# Patient Record
Sex: Female | Born: 1990 | Race: White | Hispanic: No | Marital: Single | Smoking: Never smoker
Health system: Southern US, Community
[De-identification: ages and names within clinical notes are randomized; demographics above are authoritative.]

## PROBLEM LIST (undated history)

## (undated) DIAGNOSIS — J329 Chronic sinusitis, unspecified: Secondary | ICD-10-CM

## (undated) DIAGNOSIS — E079 Disorder of thyroid, unspecified: Secondary | ICD-10-CM

## (undated) HISTORY — PX: EYE SURGERY: SHX253

## (undated) HISTORY — PX: APPENDECTOMY: SHX54

---

## 2014-09-06 ENCOUNTER — Ambulatory Visit
Admission: RE | Admit: 2014-09-06 | Discharge: 2014-09-06 | Disposition: A | Discharge status: Home / Self Care | Payer: BC Managed Care – PPO | Source: Ambulatory Visit | Attending: Otolaryngology | Admitting: Otolaryngology

## 2014-09-06 ENCOUNTER — Other Ambulatory Visit: Payer: Self-pay | Admitting: Otolaryngology

## 2014-09-06 DIAGNOSIS — R0981 Nasal congestion: Secondary | ICD-10-CM

## 2014-09-06 DIAGNOSIS — R0982 Postnasal drip: Secondary | ICD-10-CM

## 2014-10-19 ENCOUNTER — Ambulatory Visit
Admission: RE | Admit: 2014-10-19 | Discharge: 2014-10-19 | Disposition: A | Discharge status: Home / Self Care | Payer: BC Managed Care – PPO | Source: Ambulatory Visit | Attending: Emergency Medicine | Admitting: Emergency Medicine

## 2014-10-19 ENCOUNTER — Other Ambulatory Visit: Payer: Self-pay | Admitting: Emergency Medicine

## 2014-10-19 DIAGNOSIS — R109 Unspecified abdominal pain: Secondary | ICD-10-CM

## 2015-08-29 ENCOUNTER — Ambulatory Visit: Payer: Self-pay

## 2015-08-29 ENCOUNTER — Other Ambulatory Visit: Payer: Self-pay | Admitting: Occupational Medicine

## 2015-08-29 DIAGNOSIS — M25562 Pain in left knee: Secondary | ICD-10-CM

## 2016-08-07 IMAGING — CT CT ABD-PELV W/O CM
3 of 4 series · 8 of 46 positions shown, 15 images · non-contrast
Comparison: None.

CLINICAL DATA: Intermittent right flank pain for 3 days.

EXAM:
CT ABDOMEN AND PELVIS WITHOUT CONTRAST
TECHNIQUE: Multidetector CT imaging of the abdomen and pelvis was performed
following the standard protocol without IV contrast.

[Series 3: lung windows · axial · 0.68mm/px · z∈[-92,-32]mm · 4 of 21 slices shown, 9 images]
[im 5/21  soft-tissue]
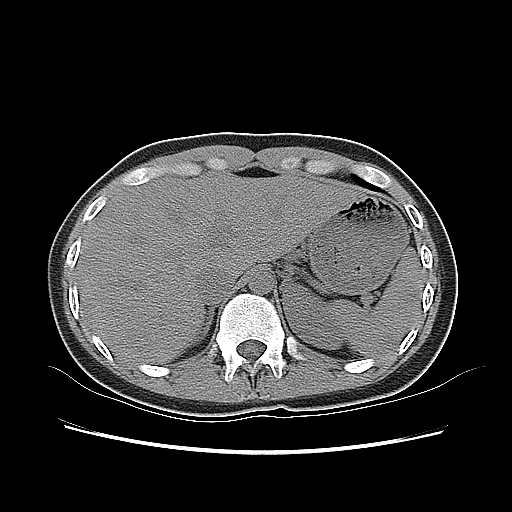
[im 5/21  lung]
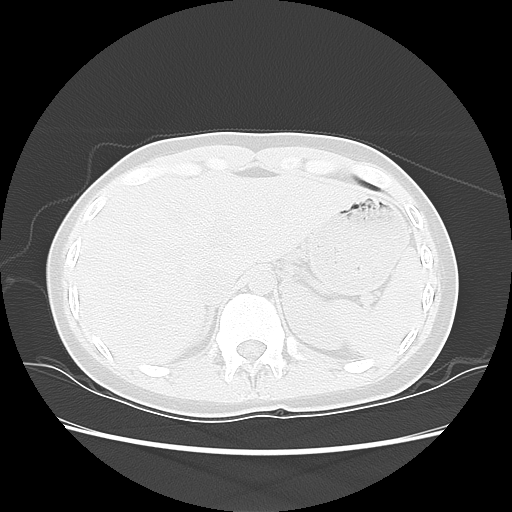
[im 5/21  bone]
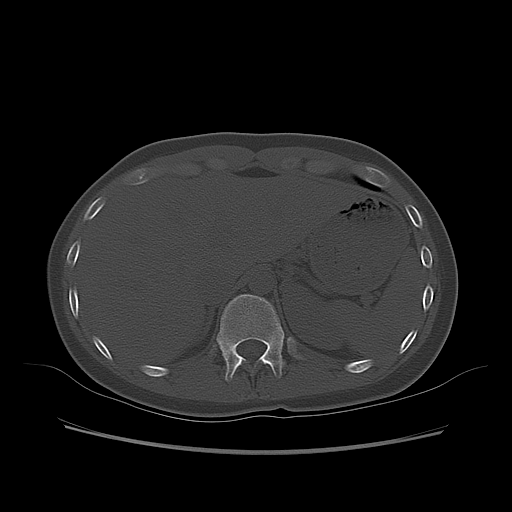
[im 9/21  soft-tissue]
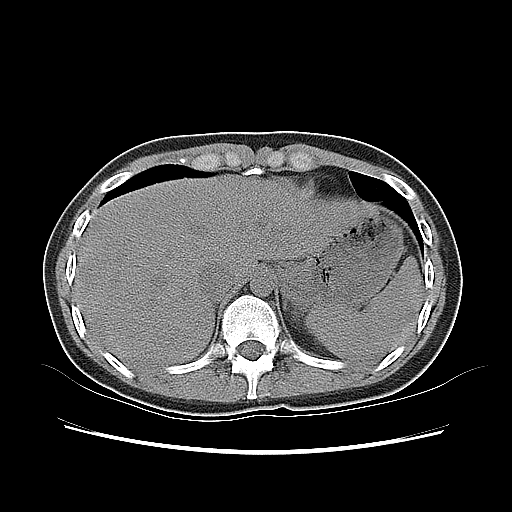
[im 9/21  lung]
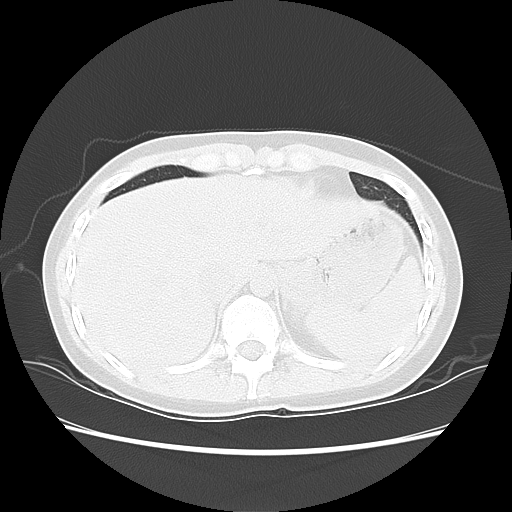
[im 13/21  soft-tissue]
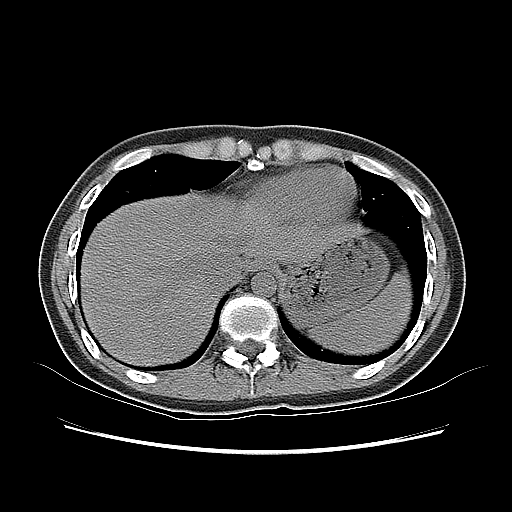
[im 13/21  lung]
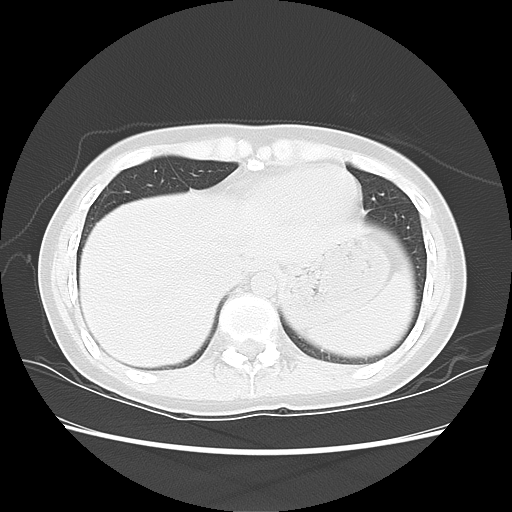
[im 17/21  soft-tissue]
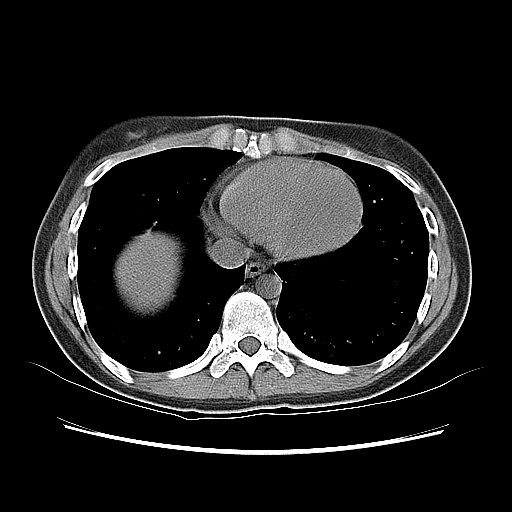
[im 17/21  lung]
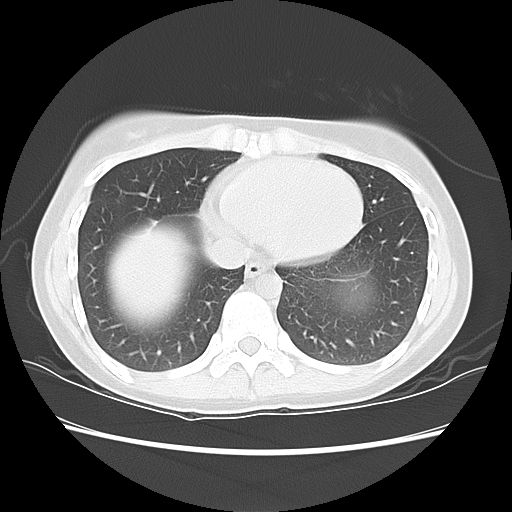

[Series 400: cor · coronal · 0.98mm/px · 3 of 116 slices shown, 4 images]
[im 39/116  soft-tissue]
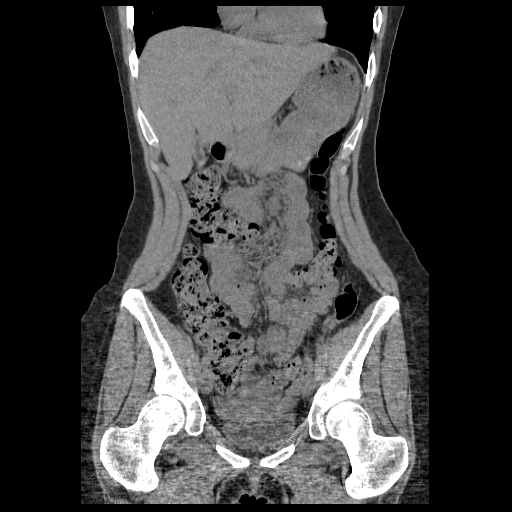
[im 52/116  soft-tissue]
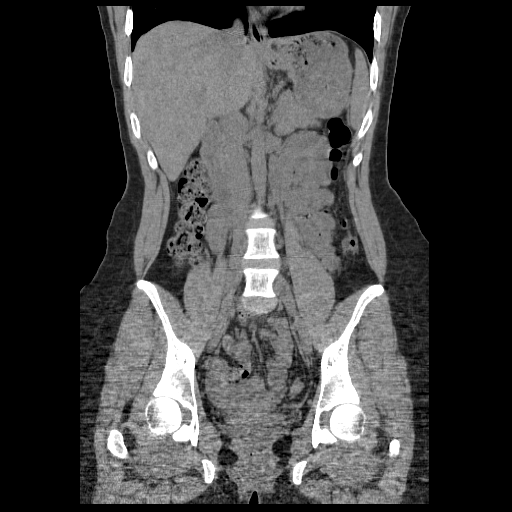
[im 52/116  bone]
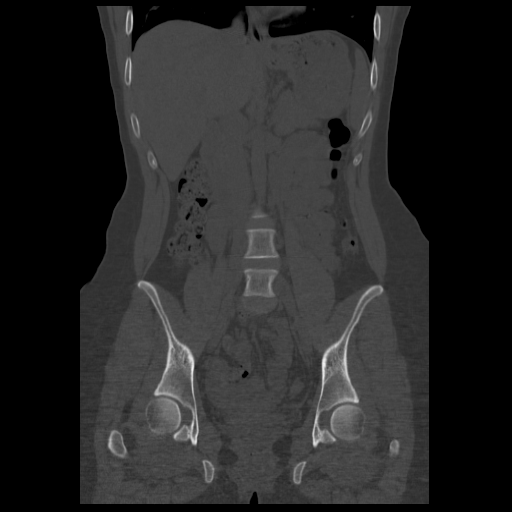
[im 64/116  soft-tissue]
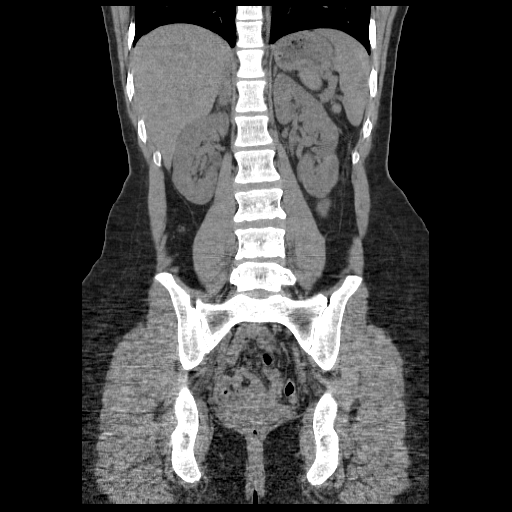

[Series 401: sag · sagittal · 0.98mm/px · 1 of 174 slices shown, 2 images]
[im 58/174  soft-tissue]
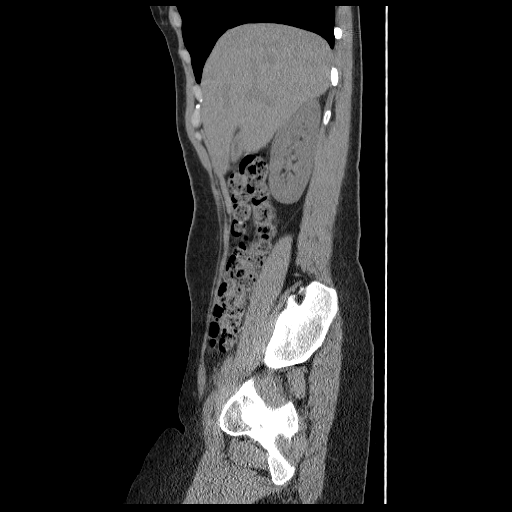
[im 58/174  bone]
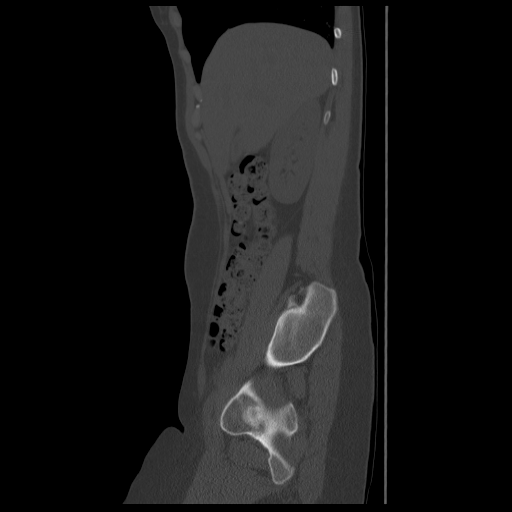

[8 of 46 positions shown; findings below may reference images not displayed]

FINDINGS: Lower chest: The lung bases are clear of acute process. No pleural
effusion or pulmonary lesions. The heart is normal in size. No
pericardial effusion. The distal esophagus and aorta are
unremarkable.

Hepatobiliary: No focal hepatic lesions or intrahepatic biliary
dilatation. The gallbladder is contracted. No common bowel duct
dilatation.

Pancreas: Normal

Spleen: Normal

Adrenals/Urinary Tract: The adrenal glands and kidneys are
unremarkable. No renal or obstructing ureteral calculi or bladder
calculi. There is a duplicated left collecting system.

Stomach/Bowel: The stomach, duodenum, small bowel and colon are
grossly normal without oral contrast. No inflammatory changes, mass
lesions or obstructive findings. The appendix is surgically absent.

Vascular/Lymphatic: No mesenteric or retroperitoneal mass or
adenopathy. The aorta is normal in caliber. No atherosclerotic
calcifications.

Reproductive: The uterus and ovaries are unremarkable. The bladder
is normal. No pelvic mass, adenopathy or free pelvic fluid
collections. No inguinal mass or adenopathy.

Other: No abdominal wall hernia or subcutaneous lesions.

Musculoskeletal: Normal.
IMPRESSION: No acute abdominal/ pelvic findings, mass lesions or adenopathy.

No renal, ureteral or bladder calculi.

## 2016-09-17 ENCOUNTER — Encounter: Payer: Self-pay | Admitting: Emergency Medicine

## 2016-09-17 ENCOUNTER — Ambulatory Visit
Admission: EM | Admit: 2016-09-17 | Discharge: 2016-09-17 | Disposition: A | Discharge status: Home / Self Care | Payer: BC Managed Care – PPO | Attending: Internal Medicine | Admitting: Internal Medicine

## 2016-09-17 DIAGNOSIS — J0110 Acute frontal sinusitis, unspecified: Secondary | ICD-10-CM | POA: Diagnosis not present

## 2016-09-17 LAB — RAPID INFLUENZA A&B ANTIGENS
Influenza A (ARMC): NEGATIVE
Influenza B (ARMC): NEGATIVE

## 2016-09-17 LAB — RAPID STREP SCREEN (MED CTR MEBANE ONLY): STREPTOCOCCUS, GROUP A SCREEN (DIRECT): NEGATIVE

## 2016-09-17 MED ORDER — AMOXICILLIN 500 MG PO CAPS: 500.0000 mg | ORAL_CAPSULE | Freq: Three times a day (TID) | ORAL | 0 refills | Status: DC

## 2016-09-17 NOTE — ED Provider Notes (Signed)
[facility]    CSN: [id number] Arrival date & time: 09/17/16  1836     History   Chief Complaint Chief Complaint  Patient presents with  . Sore Throat  . Cough    HPI Hailey Joseph is a 25 y.o. female.   Symptoms started with sore throat 4 days ago. The patient has been feeling very fatigued and going to bed early since that time. She's developed a nonproductive cough as well as headaches since that time. She denies fever, nausea, vomiting or body aches but she admits to sinus pressure. She has developed some "tan-colored" nasal drainage today.      History reviewed. No pertinent past medical history.  There are no active problems to display for this patient.   Past Surgical History:  Procedure Laterality Date  . APPENDECTOMY      OB History    No data available       Home Medications    Prior to Admission medications   Medication Sig Start Date End Date Taking? Authorizing Provider  levothyroxine (SYNTHROID, LEVOTHROID) 50 MCG tablet Take 50 mcg by mouth daily before breakfast.   Yes Historical Provider, MD  amoxicillin (AMOXIL) 500 MG capsule Take 1 capsule (500 mg total) by mouth 3 (three) times daily. 09/17/16   Arnaldo NatalMichael S Guiseppe Flanagan, MD    Family History History reviewed. No pertinent family history.  Social History Social History  Substance Use Topics  . Smoking status: Never Smoker  . Smokeless tobacco: Never Used  . Alcohol use No     Allergies   Phenergan [promethazine] and Toradol [ketorolac tromethamine]   Review of Systems Review of Systems  Constitutional: Negative for chills and fever.  HENT: Positive for sinus pressure and sore throat. Negative for tinnitus.   Eyes: Negative for redness.  Respiratory: Positive for cough. Negative for shortness of breath.   Cardiovascular: Negative for chest pain and palpitations.  Gastrointestinal: Negative for abdominal pain, diarrhea, nausea and vomiting.  Genitourinary: Negative for  dysuria, frequency and urgency.  Musculoskeletal: Negative for myalgias.  Skin: Negative for rash.       No lesions  Neurological: Positive for headaches. Negative for weakness.  Hematological: Does not bruise/bleed easily.  Psychiatric/Behavioral: Negative for suicidal ideas.     Physical Exam Triage Vital Signs ED Triage Vitals  Enc Vitals Group     BP 09/17/16 1848 127/64     Pulse Rate 09/17/16 1848 98     Resp 09/17/16 1848 16     Temp 09/17/16 1848 99 F (37.2 C)     Temp Source 09/17/16 1848 Tympanic     SpO2 09/17/16 1848 99 %     Weight 09/17/16 1846 160 lb (72.6 kg)     Height 09/17/16 1846 5\' 9"  (1.753 m)     Head Circumference --      Peak Flow --      Pain Score 09/17/16 1848 8     Pain Loc --      Pain Edu? --      Excl. in GC? --    No data found.   Updated Vital Signs BP 127/64 (BP Location: Left Arm)   Pulse 98   Temp 99 F (37.2 C) (Tympanic)   Resp 16   Ht 5\' 9"  (1.753 m)   Wt 160 lb (72.6 kg)   LMP 09/05/2016 (Exact Date)   SpO2 99%   BMI 23.63 kg/m   Visual Acuity Right Eye Distance:   Left Eye  Distance:   Bilateral Distance:    Right Eye Near:   Left Eye Near:    Bilateral Near:     Physical Exam  Constitutional: She is oriented to person, place, and time. She appears well-developed and well-nourished. No distress.  HENT:  Head: Normocephalic and atraumatic.  Mouth/Throat: Posterior oropharyngeal erythema present.  Tenderness over frontal and ethmoid sinuses  Eyes: Conjunctivae and EOM are normal. Pupils are equal, round, and reactive to light. No scleral icterus.  Neck: Normal range of motion. Neck supple. No JVD present. No tracheal deviation present. No thyromegaly present.  Cardiovascular: Normal rate, regular rhythm and normal heart sounds.  Exam reveals no gallop and no friction rub.   No murmur heard. Pulmonary/Chest: Effort normal and breath sounds normal.  Abdominal: Soft. Bowel sounds are normal. She exhibits no  distension. There is no tenderness.  Musculoskeletal: Normal range of motion. She exhibits no edema.  Lymphadenopathy:    She has no cervical adenopathy.  Neurological: She is alert and oriented to person, place, and time. No cranial nerve deficit.  Skin: Skin is warm and dry.  Psychiatric: She has a normal mood and affect. Her behavior is normal. Judgment and thought content normal.  Nursing note and vitals reviewed.    UC Treatments / Results  Labs (all labs ordered are listed, but only abnormal results are displayed) Labs Reviewed  RAPID STREP SCREEN (NOT AT [facility])  RAPID INFLUENZA A&B ANTIGENS ([facility] ONLY)  CULTURE, GROUP A STREP [facility])    EKG  EKG Interpretation None       Radiology No results found.  Procedures Procedures (including critical care time)  Medications Ordered in UC Medications - No data to display   Initial Impression / Assessment and Plan / UC Course  I have reviewed the triage vital signs and the nursing notes.  Pertinent labs & imaging results that were available during my care of the patient were reviewed by me and considered in my medical decision making (see chart for details).  Clinical Course     H/o sinusitis with developing symptoms.  Final Clinical Impressions(s) / UC Diagnoses   Final diagnoses:  Subacute frontal sinusitis    New Prescriptions New Prescriptions   AMOXICILLIN (AMOXIL) 500 MG CAPSULE    Take 1 capsule (500 mg total) by mouth 3 (three) times daily.     Arnaldo NatalMichael S Kalese Ensz, MD 09/17/16 [phone]

## 2016-09-17 NOTE — ED Triage Notes (Signed)
Patient c/o sore throat, HAs, and cough since Sunday.

## 2016-09-20 LAB — CULTURE, GROUP A STREP (THRC)

## 2016-09-22 ENCOUNTER — Telehealth: Payer: Self-pay | Admitting: *Deleted

## 2016-09-22 NOTE — Telephone Encounter (Signed)
Called patient, no answer, left message reporting a negative strep culture result. Advised patient to follow up with PCP or [facility] if symptoms persist. 

## 2016-12-21 ENCOUNTER — Ambulatory Visit
Admission: EM | Admit: 2016-12-21 | Discharge: 2016-12-21 | Disposition: A | Discharge status: Home / Self Care | Payer: BC Managed Care – PPO | Attending: Family Medicine | Admitting: Family Medicine

## 2016-12-21 ENCOUNTER — Encounter: Payer: Self-pay | Admitting: Emergency Medicine

## 2016-12-21 DIAGNOSIS — J0110 Acute frontal sinusitis, unspecified: Secondary | ICD-10-CM | POA: Diagnosis not present

## 2016-12-21 DIAGNOSIS — R110 Nausea: Secondary | ICD-10-CM | POA: Diagnosis not present

## 2016-12-21 HISTORY — DX: Disorder of thyroid, unspecified: E07.9

## 2016-12-21 MED ORDER — AMOXICILLIN-POT CLAVULANATE 875-125 MG PO TABS
1.0000 | ORAL_TABLET | Freq: Two times a day (BID) | ORAL | 0 refills | Status: AC
Start: 2016-12-21 — End: 2016-12-28

## 2016-12-21 MED ORDER — ONDANSETRON HCL 4 MG PO TABS: 4.0000 mg | ORAL_TABLET | Freq: Three times a day (TID) | ORAL | 0 refills | Status: DC | PRN

## 2016-12-21 NOTE — Discharge Instructions (Signed)
Recommend start Augmentin twice a day as directed. Increase fluid intake to maintain hydration. May take Zofran 4mg  every 8 hours as needed for nausea. Follow-up with a primary care provider in 2 to 3 days if not improving or go to ER if symptoms worsen.

## 2016-12-21 NOTE — ED Provider Notes (Signed)
CSN: [id number]     Arrival date & time 12/21/16  [id number] History   First MD Initiated Contact with Patient 12/21/16 1032     Chief Complaint  Patient presents with  . Nausea   (Consider location/radiation/quality/duration/timing/severity/associated sxs/prior Treatment) 26 year old female presents with headache, fatigue and nausea that started about 5 days ago. Now experiencing more sinus pressure, facial pain, dizziness and nasal congestion for the past 2 days. Vomited 2 days ago due to nausea. Denies any fever or coughing. 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 Had to use EpiPen and continues to feel fatigued. [place] moved to area from [place] and does not have a PCP.    The history is provided by the patient.    Past Medical History:  Diagnosis Date  . Thyroid disease    Past Surgical History:  Procedure Laterality Date  . APPENDECTOMY    . EYE SURGERY Bilateral    No family history on file. Social History  Substance Use Topics  . Smoking status: Never Smoker  . Smokeless tobacco: Never Used  . Alcohol use No   OB History    No data available     Review of Systems  Constitutional: Positive for appetite change and fatigue. Negative for chills and fever.  HENT: Positive for congestion, sinus pain and sinus pressure. Negative for ear pain and sore throat.   Eyes: Negative for discharge.  Respiratory: Negative for cough, chest tightness, shortness of breath and wheezing.   Cardiovascular: Negative for chest pain.  Gastrointestinal: Positive for nausea and vomiting. Negative for abdominal pain and diarrhea.  Musculoskeletal: Positive for arthralgias. Negative for neck pain and neck stiffness.  Skin: Negative for rash.  Neurological: Positive for dizziness and headaches. Negative for syncope and weakness.  Hematological: Negative for adenopathy.    Allergies  Phenergan  [promethazine] and Toradol [ketorolac tromethamine]  Home Medications   Prior to Admission medications   Medication Sig Start Date End Date Taking? Authorizing Provider  cetirizine (ZYRTEC) 10 MG tablet Take 10 mg by mouth daily.   Yes Historical Provider, MD  levothyroxine (SYNTHROID, LEVOTHROID) 50 MCG tablet Take 50 mcg by mouth daily before breakfast.   Yes Historical Provider, MD  amoxicillin-clavulanate (AUGMENTIN) 875-125 MG tablet Take 1 tablet by mouth every 12 (twelve) hours. 12/21/16 12/28/16  Sudie GrumblingAnn Berry Shanvi Moyd, NP  ondansetron (ZOFRAN) 4 MG tablet Take 1 tablet (4 mg total) by mouth every 8 (eight) hours as needed for nausea or vomiting. 12/21/16   Sudie GrumblingAnn Berry Sabastion Hrdlicka, NP   Meds Ordered and Administered this Visit  Medications - No data to display  BP 107/62 (BP Location: Left Arm)   Pulse 98   Temp 98.2 F (36.8 C) (Oral)   Resp 16   Ht 5\' 9"  (1.753 m)   Wt 150 lb (68 kg)   LMP 12/15/2016   SpO2 98%   BMI 22.15 kg/m  No data found.   Physical Exam  Constitutional: She is oriented to person, place, and time. She appears well-developed and well-nourished. She appears ill. No distress.  HENT:  Head: Normocephalic and atraumatic.  Right Ear: Hearing, tympanic membrane, external ear and ear canal normal.  Left Ear: Hearing, tympanic membrane, external ear and ear canal normal.  Nose: Mucosal edema and rhinorrhea present. Right sinus exhibits maxillary sinus tenderness and frontal sinus tenderness. Left sinus exhibits maxillary sinus tenderness and  frontal sinus tenderness.  Mouth/Throat: Uvula is midline, oropharynx is clear and moist and mucous membranes are normal.  Neck: Normal range of motion. Neck supple.  Cardiovascular: Normal rate, regular rhythm and normal heart sounds.   Pulmonary/Chest: Effort normal and breath sounds normal. No respiratory distress. She has no decreased breath sounds. She has no wheezes. She has no rhonchi.  Lymphadenopathy:    She has no cervical  adenopathy.  Neurological: She is alert and oriented to person, place, and time. She has normal strength. No cranial nerve deficit or sensory deficit.  Skin: Skin is warm and dry. Capillary refill takes less than 2 seconds. No rash noted.  Psychiatric: She has a normal mood and affect. Her behavior is normal. Judgment and thought content normal.    Urgent Care Course     Procedures (including critical care time)  Labs Review Labs Reviewed - No data to display  Imaging Review No results found.   Visual Acuity Review  Right Eye Distance:   Left Eye Distance:   Bilateral Distance:    Right Eye Near:   Left Eye Near:    Bilateral Near:         MDM   1. Nausea   2. Acute non-recurrent frontal sinusitis    Discussed with patient that symptoms may be the beginnings of a sinus infection. Recommend trial Augmentin 875mg  twice a day as directed. Increase fluid intake to maintain hydration. May take Zofran 4mg  every 8 hours as needed for nausea. Note written for work. Information provided regarding local PCP's- recommend follow-up with a PCP in 2 to 3 days if not improving or go to ER if symptoms worsen.     Sudie Grumbling, NP 12/22/16 0330

## 2016-12-21 NOTE — ED Triage Notes (Signed)
Nausea, vomiting, congested, headache, body aches and tired for 5

## 2017-01-22 ENCOUNTER — Ambulatory Visit
Admission: EM | Admit: 2017-01-22 | Discharge: 2017-01-22 | Disposition: A | Discharge status: Home / Self Care | Payer: BC Managed Care – PPO | Attending: Family Medicine | Admitting: Family Medicine

## 2017-01-22 DIAGNOSIS — M79641 Pain in right hand: Secondary | ICD-10-CM

## 2017-01-22 DIAGNOSIS — L089 Local infection of the skin and subcutaneous tissue, unspecified: Secondary | ICD-10-CM

## 2017-01-22 DIAGNOSIS — S60311A Abrasion of right thumb, initial encounter: Secondary | ICD-10-CM

## 2017-01-22 DIAGNOSIS — J0101 Acute recurrent maxillary sinusitis: Secondary | ICD-10-CM | POA: Diagnosis not present

## 2017-01-22 DIAGNOSIS — R42 Dizziness and giddiness: Secondary | ICD-10-CM

## 2017-01-22 DIAGNOSIS — R300 Dysuria: Secondary | ICD-10-CM | POA: Diagnosis not present

## 2017-01-22 LAB — URINALYSIS, COMPLETE (UACMP) WITH MICROSCOPIC
BACTERIA UA: NONE SEEN
Bilirubin Urine: NEGATIVE
Glucose, UA: NEGATIVE mg/dL
Hgb urine dipstick: NEGATIVE
Ketones, ur: NEGATIVE mg/dL
Leukocytes, UA: NEGATIVE
Nitrite: NEGATIVE
PROTEIN: NEGATIVE mg/dL
RBC / HPF: NONE SEEN RBC/hpf (ref 0–5)
SPECIFIC GRAVITY, URINE: 1.02 (ref 1.005–1.030)
pH: 7 (ref 5.0–8.0)

## 2017-01-22 LAB — RAPID STREP SCREEN (MED CTR MEBANE ONLY): STREPTOCOCCUS, GROUP A SCREEN (DIRECT): NEGATIVE

## 2017-01-22 MED ORDER — FEXOFENADINE-PSEUDOEPHED ER 180-240 MG PO TB24: 1.0000 | ORAL_TABLET | Freq: Every day | ORAL | 0 refills | Status: AC

## 2017-01-22 MED ORDER — CEFUROXIME AXETIL 500 MG PO TABS: 500.0000 mg | ORAL_TABLET | Freq: Two times a day (BID) | ORAL | 0 refills | Status: AC

## 2017-01-22 MED ORDER — FLUTICASONE PROPIONATE 50 MCG/ACT NA SUSP: 2.0000 | Freq: Every day | NASAL | 0 refills | Status: AC

## 2017-01-22 MED ORDER — MECLIZINE HCL 25 MG PO TABS: 25.0000 mg | ORAL_TABLET | Freq: Three times a day (TID) | ORAL | 0 refills | Status: AC | PRN

## 2017-01-22 MED ORDER — MUPIROCIN 2 % EX OINT: 1.0000 "application " | TOPICAL_OINTMENT | Freq: Three times a day (TID) | CUTANEOUS | 0 refills | Status: AC

## 2017-01-22 MED ORDER — PHENAZOPYRIDINE HCL 200 MG PO TABS: 200.0000 mg | ORAL_TABLET | Freq: Three times a day (TID) | ORAL | 0 refills | Status: AC | PRN

## 2017-01-22 NOTE — ED Provider Notes (Signed)
[facility]    CSN: [id number] Arrival date & time: 01/22/17  [id number]     History   Chief Complaint Chief Complaint  Patient presents with  . Urinary Tract Infection  . Sinusitis  . Hand Pain    Right Thumb    HPI Hailey Joseph is a 26 y.o. female.   She is a 26 year old white female early in tears crying profusely.   #1 She states that about 11 days ago she started having sinus trouble nasal congestion coughing sinus pressure and sore throat. Long sinus pressure and sore throat she reports nasal congestion and red. She's had lymph node swollen on both sides states that she does have trouble for sinuses this is been unrelenting and now she started to feel dizzy and lightheaded as well. She reports having some Zofran at home. Was greenish material which blows her nose. She does not smoke does not snuff. She does have Hashimoto's thyroid disease she's had appendectomy. Mother does have sinus surgery as well.  #2 reports also a severe sore throat this been going on for about the same amount time   #3 she reports hurting her right thumb cuticle area. She states she was trimming her, she tended to close analysis swollen red and painful  #4 frequency and burning with urination she also reports having some frequency and burning with urination going to the bathroom more often this is been going on for several days as well but nonspecific for the same time as above illnesses. She has had a history of UTIs before in the past      The history is provided by the patient. No language interpreter was used.  Urinary Tract Infection  Pain quality:  Burning Pain severity:  Moderate Onset quality:  Sudden Timing:  Constant Progression:  Worsening Chronicity:  New Recent urinary tract infections: no   Relieved by:  Nothing Worsened by:  Nothing Urinary symptoms: frequent urination   Associated symptoms: abdominal pain   Sinusitis  Pain details:    Location:  Maxillary  Quality:  Aching   Severity:  Moderate   Duration:  11 days   Timing:  Constant Progression:  Worsening Chronicity:  Recurrent Associated symptoms: rhinorrhea and sore throat   Hand Pain  This is a new problem. The current episode started more than 2 days ago. The problem occurs constantly. The problem has been gradually worsening. Associated symptoms include abdominal pain.  Sore Throat  This is a new problem. The current episode started more than 1 week ago. The problem occurs constantly. The problem has been gradually worsening. Associated symptoms include abdominal pain.    Past Medical History:  Diagnosis Date  . Thyroid disease     There are no active problems to display for this patient.   Past Surgical History:  Procedure Laterality Date  . APPENDECTOMY    . EYE SURGERY Bilateral     OB History    No data available       Home Medications    Prior to Admission medications   Medication Sig Start Date End Date Taking? Authorizing Provider  cetirizine (ZYRTEC) 10 MG tablet Take 10 mg by mouth daily.   Yes Historical Provider, MD  levothyroxine (SYNTHROID, LEVOTHROID) 50 MCG tablet Take 50 mcg by mouth daily before breakfast.   Yes Historical Provider, MD  cefUROXime (CEFTIN) 500 MG tablet Take 1 tablet (500 mg total) by mouth 2 (two) times daily. 01/22/17   Hassan RowanEugene Madhuri Vacca, MD  fexofenadine-pseudoephedrine Elvera Maria(ALLEGRA-D  ALLERGY & CONGESTION) 180-240 MG 24 hr tablet Take 1 tablet by mouth daily. 01/22/17   Hassan Rowan, MD  fluticasone (FLONASE) 50 MCG/ACT nasal spray Place 2 sprays into both nostrils daily. 01/22/17   Hassan Rowan, MD  meclizine (ANTIVERT) 25 MG tablet Take 1 tablet (25 mg total) by mouth 3 (three) times daily as needed for dizziness. 01/22/17   Hassan Rowan, MD  mupirocin ointment (BACTROBAN) 2 % Apply 1 application topically 3 (three) times daily. 01/22/17   Hassan Rowan, MD  phenazopyridine (PYRIDIUM) 200 MG tablet Take 1 tablet (200 mg total) by mouth 3 (three)  times daily as needed for pain. 01/22/17   Hassan Rowan, MD    Family History History reviewed. No pertinent family history.  Social History Social History  Substance Use Topics  . Smoking status: Never Smoker  . Smokeless tobacco: Never Used  . Alcohol use No     Allergies   Phenergan [promethazine] and Toradol [ketorolac tromethamine]   Review of Systems Review of Systems  HENT: Positive for rhinorrhea, sinus pain, sinus pressure and sore throat.   Gastrointestinal: Positive for abdominal pain.  Genitourinary: Positive for dysuria, frequency and urgency.  Musculoskeletal: Positive for joint swelling.  Skin: Positive for wound.  Neurological: Positive for dizziness.  All other systems reviewed and are negative.    Physical Exam Triage Vital Signs ED Triage Vitals  Enc Vitals Group     BP 01/22/17 0952 111/64     Pulse --      Resp 01/22/17 0952 18     Temp 01/22/17 0952 98.2 F (36.8 C)     Temp Source 01/22/17 0952 Oral     SpO2 01/22/17 0952 (!) 18 %     Weight 01/22/17 0953 150 lb (68 kg)     Height --      Head Circumference --      Peak Flow --      Pain Score 01/22/17 0954 8     Pain Loc --      Pain Edu? --      Excl. in GC? --    No data found.   Updated Vital Signs BP 107/60 (BP Location: Left Arm)   Pulse 78   Temp 98 F (36.7 C) (Oral)   Resp 18   Wt 150 lb (68 kg)   LMP 01/09/2017   SpO2 100%   BMI 22.15 kg/m   Visual Acuity Right Eye Distance:   Left Eye Distance:   Bilateral Distance:    Right Eye Near:   Left Eye Near:    Bilateral Near:     Physical Exam  Constitutional: She is oriented to person, place, and time. She appears well-developed. She appears ill. She appears distressed.  HENT:  Head: Normocephalic and atraumatic.  Right Ear: Hearing, tympanic membrane, external ear and ear canal normal.  Left Ear: Hearing, tympanic membrane, external ear and ear canal normal.  Nose: Mucosal edema and rhinorrhea present. No  epistaxis.  No foreign bodies. Right sinus exhibits maxillary sinus tenderness and frontal sinus tenderness. Left sinus exhibits maxillary sinus tenderness and frontal sinus tenderness.  Mouth/Throat: Uvula is midline. No uvula swelling. Posterior oropharyngeal erythema present.  Eyes: Conjunctivae are normal. Pupils are equal, round, and reactive to light.  Neck: Neck supple. No tracheal deviation present.  Cardiovascular: Normal rate, regular rhythm and normal heart sounds.   Pulmonary/Chest: Effort normal and breath sounds normal.  Abdominal: Soft. She exhibits no distension. There is no tenderness.  Musculoskeletal: Normal range of motion. She exhibits tenderness.       Hands: There is some mild redness around the right cuticle bed on the right thumb  Lymphadenopathy:    She has cervical adenopathy.  Neurological: She is alert and oriented to person, place, and time. No cranial nerve deficit.  Skin: Skin is warm.  Psychiatric: She has a normal mood and affect.  Vitals reviewed.    UC Treatments / Results  Labs (all labs ordered are listed, but only abnormal results are displayed) Labs Reviewed  URINALYSIS, COMPLETE (UACMP) WITH MICROSCOPIC - Abnormal; Notable for the following:       Result Value   Squamous Epithelial / LPF 6-30 (*)    All other components within normal limits  URINE CULTURE  RAPID STREP SCREEN (NOT AT [facility])    EKG  EKG Interpretation None       Radiology No results found.  Procedures Procedures (including critical care time)  Medications Ordered in UC Medications - No data to display Results for orders placed or performed during the hospital encounter of 01/22/17  Urinalysis, Complete w Microscopic  Result Value Ref Range   Color, Urine YELLOW YELLOW   APPearance CLEAR CLEAR   Specific Gravity, Urine 1.020 1.005 - 1.030   pH 7.0 5.0 - 8.0   Glucose, UA NEGATIVE NEGATIVE mg/dL   Hgb urine dipstick NEGATIVE NEGATIVE   Bilirubin Urine NEGATIVE  NEGATIVE   Ketones, ur NEGATIVE NEGATIVE mg/dL   Protein, ur NEGATIVE NEGATIVE mg/dL   Nitrite NEGATIVE NEGATIVE   Leukocytes, UA NEGATIVE NEGATIVE   Squamous Epithelial / LPF 6-30 (A) NONE SEEN   WBC, UA 0-5 0 - 5 WBC/hpf   RBC / HPF NONE SEEN 0 - 5 RBC/hpf   Bacteria, UA NONE SEEN NONE SEEN    Initial Impression / Assessment and Plan / UC Course  I have reviewed the triage vital signs and the nursing notes.  Pertinent labs & imaging results that were available during my care of the patient were reviewed by me and considered in my medical decision making (see chart for details).    Patient was in his some tears crying actively. Passive. The visiting thing done wall she denies it. States she just feels bad. Work doing a strep test but I told her that regard treated for sinus infection since it's been present for 11 days. Because she has infected fingernail I'm going to place her on Ceftin for the sinuses and the fingernail and will add Bactroban ointment. For the dizziness FOR SOME ANTIVERT TO USE IF SHE NEEDS IT SHE HAS ZOFRAN AT HOME. ALSO PLACE AND ALLEGRA-D) AND FLONASE NASAL SPRAY. BECAUSE OF THE FREQUENCY E LOW URINE IS AT NOT ABNORMAL WE'LL PLACE ON PYRIDIUM FOR THE SYMPTOMS WE'LL DO A CULTURE JUST TO MAKE SURE THIS NOTHING GROWING AND SHE WILL BE ON CEFTIN FOR THE SINUS INFECTION AS WELL. WORK NOTE FOR TODAY AND TOMORROW.   Final Clinical Impressions(s) / UC Diagnoses   Final diagnoses:  Dysuria  Hand pain, right  Infected abrasion of thumb, right, initial encounter  Acute recurrent maxillary sinusitis  Dizzinesses    New Prescriptions New Prescriptions   CEFUROXIME (CEFTIN) 500 MG TABLET    Take 1 tablet (500 mg total) by mouth 2 (two) times daily.   FEXOFENADINE-PSEUDOEPHEDRINE (ALLEGRA-D ALLERGY & CONGESTION) 180-240 MG 24 HR TABLET    Take 1 tablet by mouth daily.   FLUTICASONE (FLONASE) 50 MCG/ACT NASAL SPRAY    Place  2 sprays into both nostrils daily.   MECLIZINE  (ANTIVERT) 25 MG TABLET    Take 1 tablet (25 mg total) by mouth 3 (three) times daily as needed for dizziness.   MUPIROCIN OINTMENT (BACTROBAN) 2 %    Apply 1 application topically 3 (three) times daily.   PHENAZOPYRIDINE (PYRIDIUM) 200 MG TABLET    Take 1 tablet (200 mg total) by mouth 3 (three) times daily as needed for pain.      Note: This dictation was prepared with Dragon dictation along with smaller phrase technology. Any transcriptional errors that result from this process are unintentional.   Hassan Rowan, MD 01/22/17 1037

## 2017-01-22 NOTE — ED Triage Notes (Signed)
Pt c/o headache, sinus pain, shortness of breath with walking, she also mentions pain in her right thumb. She says there are sharp pains that shoot up her hand. She also says it burns when she voids and frequency. These symptoms have been going on for about a week.

## 2017-01-24 LAB — URINE CULTURE: Special Requests: NORMAL

## 2017-01-25 LAB — CULTURE, GROUP A STREP (THRC)

## 2017-03-09 ENCOUNTER — Encounter: Payer: Self-pay | Admitting: *Deleted

## 2017-03-09 ENCOUNTER — Ambulatory Visit
Admission: EM | Admit: 2017-03-09 | Discharge: 2017-03-09 | Disposition: A | Discharge status: Home / Self Care | Payer: BC Managed Care – PPO | Attending: Family Medicine | Admitting: Family Medicine

## 2017-03-09 DIAGNOSIS — R112 Nausea with vomiting, unspecified: Secondary | ICD-10-CM | POA: Diagnosis not present

## 2017-03-09 DIAGNOSIS — R197 Diarrhea, unspecified: Secondary | ICD-10-CM | POA: Diagnosis not present

## 2017-03-09 HISTORY — DX: Chronic sinusitis, unspecified: J32.9

## 2017-03-09 MED ORDER — ONDANSETRON 8 MG PO TBDP
8.0000 mg | ORAL_TABLET | Freq: Once | ORAL | Status: AC
  Administered 2017-03-09: 8 mg via ORAL

## 2017-03-09 NOTE — Discharge Instructions (Signed)
Take zofran as needed. Rest. Drink plenty of fluids. Follow BRAT diet.   Follow up with your primary care physician this week as needed. Return to Urgent care for new or worsening concerns.

## 2017-03-09 NOTE — ED Triage Notes (Signed)
Patient started having symptoms of nausea vomiting and diarrhea this AM.

## 2017-03-09 NOTE — ED Notes (Signed)
Patient given some water to drink.

## 2017-03-09 NOTE — ED Provider Notes (Signed)
[facility] ____________________________________________  Time seen: Approximately 1:55 PM  I have reviewed the triage vital signs and the nursing notes.   HISTORY  Chief Complaint Emesis; Nausea; and Diarrhea   HPI Hailey Joseph is a 26 y.o. female presenting for evaluation of nausea, vomiting and diarrhea with abdominal discomfort that started this morning. Patient reports that the diarrhea will correct at 4 AM this morning. Reports after trying to eat something this morning she vomited. Reports one episode of vomiting, continued nausea and approximately 5 episodes diarrhea. Denies any abnormal color stool, black stool, melena, hematochezia. Denies hemoptysis. Reports has not tried any over-the-counter medications her home medications prior to arrival. Denies any syncopal or near syncopal events. Reports associated abdominal cramping discomfort that is worse prior to needing to have diarrhea and then improves after diarrheal episode. Denies any recent sickness. Denies any known food triggers. Denies known sick contacts. Reports she is a Runner, broadcasting/film/video. Denies cough, fevers, sore throat.   Denies chest pain, shortness of breath,  dysuria, extremity pain, extremity swelling or rash.   Patient's last menstrual period was 02/05/2017. Patient reports recently started on oral contraceptives and on the third week and reports her menstrual bee next week. Denies concerns or chance of pregnancy.   Past Medical History:  Diagnosis Date  . Sinus infection   . Thyroid disease     There are no active problems to display for this patient.   Past Surgical History:  Procedure Laterality Date  . APPENDECTOMY    . EYE SURGERY Bilateral      No current facility-administered medications for this encounter.   Current Outpatient Prescriptions:  .  cetirizine (ZYRTEC) 10 MG tablet, Take 10 mg by mouth daily., Disp: , Rfl:  .  levothyroxine (SYNTHROID, LEVOTHROID) 50 MCG tablet, Take 50 mcg  by mouth daily before breakfast., Disp: , Rfl:  .  ranitidine (ZANTAC) 75 MG tablet, Take 75 mg by mouth 2 (two) times daily., Disp: , Rfl:  .  cefUROXime (CEFTIN) 500 MG tablet, Take 1 tablet (500 mg total) by mouth 2 (two) times daily., Disp: 20 tablet, Rfl: 0 .  fexofenadine-pseudoephedrine (ALLEGRA-D ALLERGY & CONGESTION) 180-240 MG 24 hr tablet, Take 1 tablet by mouth daily., Disp: 30 tablet, Rfl: 0 .  fluticasone (FLONASE) 50 MCG/ACT nasal spray, Place 2 sprays into both nostrils daily., Disp: 16 g, Rfl: 0 .  meclizine (ANTIVERT) 25 MG tablet, Take 1 tablet (25 mg total) by mouth 3 (three) times daily as needed for dizziness., Disp: 30 tablet, Rfl: 0 .  mupirocin ointment (BACTROBAN) 2 %, Apply 1 application topically 3 (three) times daily., Disp: 22 g, Rfl: 0 .  phenazopyridine (PYRIDIUM) 200 MG tablet, Take 1 tablet (200 mg total) by mouth 3 (three) times daily as needed for pain., Disp: 15 tablet, Rfl: 0  Allergies Bee pollen; Orange fruit [citrus]; Phenergan [promethazine]; and Toradol [ketorolac tromethamine]  History reviewed. No pertinent family history.  Social History Social History  Substance Use Topics  . Smoking status: Never Smoker  . Smokeless tobacco: Never Used  . Alcohol use No    Review of Systems Constitutional: No fever/chills Eyes: No visual changes. ENT: No sore throat. Cardiovascular: Denies chest pain. Respiratory: Denies shortness of breath. Gastrointestinal: As above Genitourinary: Negative for dysuria. Musculoskeletal: Negative for back pain. Skin: Negative for rash.   ____________________________________________   PHYSICAL EXAM:  VITAL SIGNS: ED Triage Vitals  Enc Vitals Group     BP 03/09/17 1337 108/64  Pulse Rate 03/09/17 1337 71     Resp 03/09/17 1337 16     Temp 03/09/17 1337 98.6 F (37 C)     Temp Source 03/09/17 1337 Oral     SpO2 03/09/17 1337 100 %     Weight 03/09/17 1338 170 lb (77.1 kg)     Height 03/09/17 1338   (1.753 m)     Head Circumference --      Peak Flow --      Pain Score 03/09/17 1340 2     Pain Loc --      Pain Edu? --      Excl. in GC? --     Constitutional: Alert and oriented. Well appearing and in no acute distress. Eyes: Conjunctivae are normal. PERRL. EOMI. ENT      Head: Normocephalic and atraumatic.      Mouth/Throat: Mucous membranes are moist.Oropharynx non-erythematous. Cardiovascular: Normal rate, regular rhythm. Grossly normal heart sounds.  Good peripheral circulation. Respiratory: Normal respiratory effort without tachypnea nor retractions. Breath sounds are clear and equal bilaterally. No wheezes, rales, rhonchi. Gastrointestinal: Mild epigastric, mild right lower quadrant tenderness to palpation, and minimal to mild generalized tenderness, no suprapubic tenderness. No distention. Normal Bowel sounds. No CVA tenderness. Musculoskeletal:  Steady gait. Neurologic:  Normal speech and language. Speech is normal. No gait instability.  Skin:  Skin is warm, dry and intact. No rash noted. Psychiatric: Mood and affect are normal. Speech and behavior are normal. Patient exhibits appropriate insight and judgment   ___________________________________________   LABS (all labs ordered are listed, but only abnormal results are displayed)  Labs Reviewed - No data to display  PROCEDURES Procedures     INITIAL IMPRESSION / ASSESSMENT AND PLAN / ED COURSE  Pertinent labs & imaging results that were available during my care of the patient were reviewed by me and considered in my medical decision making (see chart for details).  Overall well-appearing patient. No acute distress. Diffuse abdominal tenderness, more so in right lower abdomen and epigastric, previous appendectomy. 8 mg ODT Zofran given once. Suspect viral illness.  After Zofran, patient tolerating fluids in urgent care. Patient states that she has Zofran at home and does not need another prescription. Encouraged  rest, fluids, Zofran and BRAT diet. Work note given for standby. Discussed strict follow-up and return parameters.Discussed indication, risks and benefits of medications with patient.  Discussed follow up with Primary care physician this week. Discussed follow up and return parameters including no resolution or any worsening concerns. Patient verbalized understanding and agreed to plan.   ____________________________________________   FINAL CLINICAL IMPRESSION(S) / ED DIAGNOSES  Final diagnoses:  Nausea vomiting and diarrhea     Discharge Medication List as of 03/09/2017  2:48 PM      Note: This dictation was prepared with Dragon dictation along with smaller phrase technology. Any transcriptional errors that result from this process are unintentional.         Renford Dills, NP 03/09/17 1531
